# Patient Record
Sex: Female | Born: 1975 | Hispanic: Yes | Marital: Married | Smoking: Never smoker
Health system: Southern US, Community
[De-identification: ages and names within clinical notes are randomized; demographics above are authoritative.]

## PROBLEM LIST (undated history)

## (undated) DIAGNOSIS — N809 Endometriosis, unspecified: Secondary | ICD-10-CM

## (undated) DIAGNOSIS — N83209 Unspecified ovarian cyst, unspecified side: Secondary | ICD-10-CM

## (undated) DIAGNOSIS — E119 Type 2 diabetes mellitus without complications: Secondary | ICD-10-CM

## (undated) HISTORY — DX: Type 2 diabetes mellitus without complications: E11.9

## (undated) HISTORY — DX: Unspecified ovarian cyst, unspecified side: N83.209

## (undated) HISTORY — PX: OVARIAN CYST REMOVAL: SHX89

## (undated) HISTORY — DX: Endometriosis, unspecified: N80.9

---

## 1998-09-29 ENCOUNTER — Ambulatory Visit (HOSPITAL_COMMUNITY): Admission: RE | Admit: 1998-09-29 | Discharge: 1998-09-29 | Payer: Self-pay | Admitting: *Deleted

## 1998-12-15 ENCOUNTER — Ambulatory Visit (HOSPITAL_COMMUNITY): Admission: RE | Admit: 1998-12-15 | Discharge: 1998-12-15 | Payer: Self-pay

## 1999-02-18 ENCOUNTER — Encounter (HOSPITAL_COMMUNITY): Admission: RE | Admit: 1999-02-18 | Discharge: 1999-02-23 | Payer: Self-pay | Admitting: Obstetrics & Gynecology

## 1999-02-22 ENCOUNTER — Inpatient Hospital Stay (HOSPITAL_COMMUNITY): Admission: AD | Admit: 1999-02-22 | Discharge: 1999-02-24 | Payer: Self-pay | Admitting: *Deleted

## 2000-05-24 ENCOUNTER — Ambulatory Visit (HOSPITAL_COMMUNITY): Admission: RE | Admit: 2000-05-24 | Discharge: 2000-05-24 | Payer: Self-pay | Admitting: *Deleted

## 2000-09-18 ENCOUNTER — Encounter (HOSPITAL_COMMUNITY): Admission: AD | Admit: 2000-09-18 | Discharge: 2000-09-20 | Payer: Self-pay | Admitting: *Deleted

## 2000-09-23 ENCOUNTER — Inpatient Hospital Stay (HOSPITAL_COMMUNITY): Admission: AD | Admit: 2000-09-23 | Discharge: 2000-09-26 | Payer: Self-pay | Admitting: Obstetrics & Gynecology

## 2000-09-23 ENCOUNTER — Encounter (INDEPENDENT_AMBULATORY_CARE_PROVIDER_SITE_OTHER): Payer: Self-pay | Admitting: Specialist

## 2005-08-12 ENCOUNTER — Emergency Department (HOSPITAL_COMMUNITY): Admission: EM | Admit: 2005-08-12 | Discharge: 2005-08-13 | Payer: Self-pay | Admitting: Emergency Medicine

## 2005-11-27 ENCOUNTER — Ambulatory Visit: Payer: Self-pay | Admitting: Internal Medicine

## 2005-12-01 ENCOUNTER — Ambulatory Visit: Payer: Self-pay | Admitting: *Deleted

## 2006-10-31 ENCOUNTER — Encounter (INDEPENDENT_AMBULATORY_CARE_PROVIDER_SITE_OTHER): Payer: Self-pay | Admitting: *Deleted

## 2007-12-19 ENCOUNTER — Emergency Department (HOSPITAL_COMMUNITY): Admission: EM | Admit: 2007-12-19 | Discharge: 2007-12-20 | Payer: Self-pay | Admitting: Emergency Medicine

## 2007-12-23 ENCOUNTER — Inpatient Hospital Stay (HOSPITAL_COMMUNITY): Admission: AD | Admit: 2007-12-23 | Discharge: 2007-12-23 | Payer: Self-pay | Admitting: Obstetrics & Gynecology

## 2008-01-27 ENCOUNTER — Ambulatory Visit (HOSPITAL_COMMUNITY): Admission: RE | Admit: 2008-01-27 | Discharge: 2008-01-27 | Payer: Self-pay | Admitting: Family Medicine

## 2008-01-29 ENCOUNTER — Ambulatory Visit: Payer: Self-pay | Admitting: Obstetrics & Gynecology

## 2008-04-27 ENCOUNTER — Inpatient Hospital Stay (HOSPITAL_COMMUNITY): Admission: AD | Admit: 2008-04-27 | Discharge: 2008-04-30 | Payer: Self-pay | Admitting: Obstetrics & Gynecology

## 2008-04-27 ENCOUNTER — Ambulatory Visit: Payer: Self-pay | Admitting: Obstetrics & Gynecology

## 2008-04-27 ENCOUNTER — Encounter: Payer: Self-pay | Admitting: Obstetrics & Gynecology

## 2008-06-10 ENCOUNTER — Ambulatory Visit: Payer: Self-pay | Admitting: Obstetrics & Gynecology

## 2008-10-09 ENCOUNTER — Ambulatory Visit: Payer: Self-pay | Admitting: Obstetrics & Gynecology

## 2008-10-09 ENCOUNTER — Encounter: Payer: Self-pay | Admitting: Advanced Practice Midwife

## 2010-02-04 ENCOUNTER — Ambulatory Visit (HOSPITAL_COMMUNITY)
Admission: RE | Admit: 2010-02-04 | Discharge: 2010-02-04 | Payer: Self-pay | Source: Home / Self Care | Attending: Obstetrics & Gynecology | Admitting: Obstetrics & Gynecology

## 2010-02-04 ENCOUNTER — Ambulatory Visit: Payer: Self-pay | Admitting: Obstetrics & Gynecology

## 2010-02-14 ENCOUNTER — Ambulatory Visit: Payer: Self-pay | Admitting: Family Medicine

## 2010-05-09 ENCOUNTER — Emergency Department (HOSPITAL_COMMUNITY)
Admission: EM | Admit: 2010-05-09 | Discharge: 2010-05-10 | Disposition: A | Discharge status: Home / Self Care | Payer: Self-pay | Attending: Emergency Medicine | Admitting: Emergency Medicine

## 2010-05-09 DIAGNOSIS — W540XXA Bitten by dog, initial encounter: Secondary | ICD-10-CM | POA: Insufficient documentation

## 2010-05-09 DIAGNOSIS — S71109A Unspecified open wound, unspecified thigh, initial encounter: Secondary | ICD-10-CM | POA: Insufficient documentation

## 2010-05-09 DIAGNOSIS — S71009A Unspecified open wound, unspecified hip, initial encounter: Secondary | ICD-10-CM | POA: Insufficient documentation

## 2010-05-09 DIAGNOSIS — Y92009 Unspecified place in unspecified non-institutional (private) residence as the place of occurrence of the external cause: Secondary | ICD-10-CM | POA: Insufficient documentation

## 2010-05-26 LAB — CBC
Hemoglobin: 8 g/dL — ABNORMAL LOW (ref 12.0–15.0)
Hemoglobin: 8.2 g/dL — ABNORMAL LOW (ref 12.0–15.0)
Hemoglobin: 10.8 g/dL — ABNORMAL LOW (ref 12.0–15.0)
MCV: 87.2 fL (ref 78.0–100.0)
MCV: 87.5 fL (ref 78.0–100.0)
MCV: 86.5 fL (ref 78.0–100.0)
Platelets: 248 10*3/uL (ref 150–400)
Platelets: 270 10*3/uL (ref 150–400)
RDW: 13.1 % (ref 11.5–15.5)
RDW: 13 % (ref 11.5–15.5)
WBC: 11.6 10*3/uL — ABNORMAL HIGH (ref 4.0–10.5)

## 2010-05-26 LAB — URINALYSIS, ROUTINE W REFLEX MICROSCOPIC
Bilirubin Urine: NEGATIVE
Ketones, ur: NEGATIVE mg/dL
Nitrite: NEGATIVE
pH: 6.5 (ref 5.0–8.0)

## 2010-05-26 LAB — DIFFERENTIAL
Basophils Absolute: 0 10*3/uL (ref 0.0–0.1)
Eosinophils Relative: 0 % (ref 0–5)
Lymphs Abs: 1.4 10*3/uL (ref 0.7–4.0)
Monocytes Relative: 2 % — ABNORMAL LOW (ref 3–12)
Neutrophils Relative %: 86 % — ABNORMAL HIGH (ref 43–77)

## 2010-05-26 LAB — POCT PREGNANCY, URINE: Preg Test, Ur: NEGATIVE

## 2010-06-28 NOTE — Group Therapy Note (Signed)
NAME:  LOY, LITTLE NO.:  [id number]   MEDICAL RECORD NO.:  [id number]          PATIENT TYPE:  WOC   LOCATION:  [facility]                   FACILITY:  [facility]   PHYSICIAN:  Elsie Lincoln, MD      DATE OF BIRTH:  1975/12/14   DATE OF SERVICE:                                  CLINIC NOTE   HISTORY:  The patient is a 35 year old female who presents for postop  check.  The patient is Hispanic-speaking, however, they have forgone the  Spanish interpreter in order to expedite their visit.  The husband is  translating.  The patient underwent an exploratory laparotomy, lysis of  adhesions, left salpingo-oophorectomy, right salpingectomy and right  ovarian cystectomy.  The patient had extensive endometriosis.  The  patient has done very well postoperatively.  She is clear for all sexual  activity and physical activity.  The patient does still have an IUD in  situ and I offered to remove it today, but she would like to wait until  August when she is due for her Pap smear.  She normally gets her Pap  smears done at [facility].  However, I do not know if they  will do it now that she has bilateral salpingectomies and no longer  needs family planning.  We will see her back here in August for that.  Also in the past 6 weeks, the patient has not had a period, so I want to  make sure she is not in menopause which is another reason I would like  to do surveillance of her in August to make sure that she is  menstruating.  The patient's IUD is a 10 year ParaGard, so there is no  progesterone involved.   PHYSICAL EXAMINATION:  VITAL SIGNS:  Temperature 97.4, pulse 80, blood  pressure 116/81, weight 160.5, height 64 inches.  GENERAL:  Well-nourished, well-developed, in no apparent distress.  ABDOMEN:  Soft, nontender.  No rebound.  No guarding.  Incision well  healed.   ASSESSMENT/PLAN:  A 35 year old female with normal postoperative exam.  1. Pap smear.  2.  Intrauterine device removal in August.           ______________________________  Elsie Lincoln, MD     KL/MEDQ  D:  06/10/2008  T:  06/10/2008  Job:  [id number]

## 2010-06-28 NOTE — Op Note (Signed)
NAME:  Sherry Ramirez, Sherry Ramirez      ACCOUNT NO.:  [id number]   MEDICAL RECORD NO.:  [id number]          PATIENT TYPE:  INP   LOCATION:  9316                          [facility]   PHYSICIAN:  Allie Bossier, MD        DATE OF BIRTH:  07/06/1975   DATE OF PROCEDURE:  04/28/2008  DATE OF DISCHARGE:                               OPERATIVE REPORT   PREOPERATIVE DIAGNOSIS:  Rapidly enlarging bilateral ovarian cyst  probable endometrioma.   POSTOPERATIVE DIAGNOSIS:  Rapidly enlarging bilateral ovarian cyst  probable endometrioma with an adhesions.   PROCEDURE:  Exploratory laparotomy, lysis of adhesions, removal of right  ovarian cyst, and right oviduct, and left salpingo-oophorectomy.   SURGEON:  Allie Bossier, MD   ASSISTANT:  Lonzo Cloud, PA student.   ANESTHESIA:  GETA Foster/Hatchett, MD   COMPLICATIONS:  None.   ESTIMATED BLOOD LOSS:  Less than 300 mL.   SPECIMENS:  Right ovarian cyst and right oviduct, and left ovary and  oviduct.   FINDINGS:  1. Dense adhesions of the ovaries bilaterally to the bowel and cul-de-      sac and uterus.  2. Evidence of endometriosis throughout the pelvis.  3. Endometriomas bilaterally.   DETAILED PROCEDURE AND FINDINGS:  Preoperatively, I explained Sherry  Ramirez on multiple occasions with the help of the interpreter at  __________ that I would do my best to leave part of an ovary, so that  she could would need hormones, this is what she desires.  I also  explained that should it be possible then I would have to take both  ovaries entirely out.  I also explained the risks of surgery including,  but not limited to infection, bleeding, damage to bowel, bladder,  ureter, and DVTs.  Preoperatively, she was given IV antibiotics and SCDs  were in place preoperatively and throughout the case.  In the OR, her  general anesthetic was applied without complication.  Her abdomen and  vagina were prepped and draped in the usual sterile  fashion.  Foley  catheter which drained 200 mL of clear urine throughout the case.  A  transverse incision was made approximately 2 cm above the symphysis  pubis.  Incision was carried down through the subcutaneous tissue to the  fascia.  Fascia was scored in the midline.  Fascial incision was  extended bilaterally and curved slightly upwards.  The middle one third  of the rectus muscles were separated in a transverse fashion using  electrosurgical technique.  The peritoneum was elevated with hemostats  and incised with Metzenbaum scissors.  The peritoneal incision was  extended bilaterally with the Metzenbaum scissors.  There was a very  large (approximately 11 cm) right ovarian cyst just beneath the  peritoneum.  It appeared to be an endometrioma based on the brown  staining lesions on the outside of the ovary.  I did obtain washings  should it turn out to not be an endometrioma.  Manually, I was able to  feel the extent of the cyst/ovary, it was densely adherent to the  posterior cul-de-sac and the bowel and the uterus in the course of  trying to dissect these adhesions delicately with my fingers.  A large  amount of chocolate cyst fluid was noted, multiple liters of irrigation  were used.  Once the cyst had ruptured, I was able to visualize the  entire pelvis.  The bowel was packed with moist lap sponges and the  abdominal sidewalls were protected with moist lap sponges and O'Connor-  O'Sullivan self-retaining retractor was placed.  The cyst wall of the  right ovary was grasped with a Babcock clamp and the pelvis was  inspected.  I was unable to see the ureter on the right, therefore I  dissected it out by opening the medial wall of the broad ligament.  With  careful dissection, I was able to visualize the ureter on the right and  I was able to dissect out the infundibulopelvic ligament.  The IP  ligament was doubly clamped and ligated.  A 2-0 Vicryl sutures used  throughout this case  unless otherwise specified.  I then proceeded with  careful dissection of the ovarian cyst wall off the wall of the uterus  in the posterior cul-de-sac.  I was able to remove the cyst.  There was  approximately 1 cm above normal ovarian tissue that I left connected to  the utero-ovarian ligaments.  The remainder of the cyst wall and oviduct  on the right were removed and sent to pathology.  Pelvis was irrigated  again, copious amounts of warm normal saline, and I proceeded to inspect  the left ovary.  It was approximately 4 cm in diameter, it appeared to  be tense, which I would suspect to be a chocolate cyst.  Left ureter was  plainly visible and out of the operative site.  I opened the cyst wall  on the left just to confirm there was endometrioma and it was.  I  dissected out the infundibulopelvic ligament on the left, it was doubly  clamped, cut, and doubly ligated with 2-0 Vicryl suture.  The utero-  ovarian ligament on the left was clamped, cut, and ligated, and the  ovary and tube on the left were removed.  The pelvis was irrigated with  copious amounts of warm normal saline.  Both ureters appeared normal,  and I placed a piece of Gelfoam over some small serosal edges in the  ovarian fossa on both sides.  No bleeding was noted.  The sponges were  removed as well as the O'Connor-O'Sullivan self-retaining retractor.  The fascia was closed with a 0 Vicryl running nonlocking suture.  No  defects were palpable.  The subcutaneous tissue was irrigated cleaned  and dried.  It was then infiltrated with 30 mL of 0.5% Marcaine.  A  subcuticular closure was done with 3-0 Vicryl suture.  Steri-Strips were  placed.  She was taken to recovery room in stable condition.  Instruments, sponge, and needles counts were correct.  Her Foley  catheter drained 200 mL of clear urine.  She was given 1700 mL of  crystalloid.  She tolerated the surgery well.      Allie Bossier, MD  Electronically  Signed     MCD/MEDQ  D:  04/28/2008  T:  04/28/2008  Job:  [id number]

## 2010-06-28 NOTE — Group Therapy Note (Signed)
NAME:  Sherry Ramirez, Sherry Ramirez NO.:  [id number]   MEDICAL RECORD NO.:  [id number]          PATIENT TYPE:  WOC   LOCATION:  [facility]                   [facility]ACILITY:  [facility]   PHYSICIAN:  Scheryl Darter, MD       DATE OF BIRTH:  Jul 07, 1975   DATE OF SERVICE:  10/09/2008                                  CLINIC NOTE   HISTORY OF PRESENT ILLNESS:  This is a 35 year old, gravida 4, para 4,  who is not pregnant, who presents for her annual exam and removal of  IUD.  She was seen for a postop check in April following a surgery that  occurred in March which included exploratory laparotomy, lysis of  adhesions, left salpingo-oophorectomy, and right salpingectomy with a  right ovarian cystectomy.  The patient had extensive endometriosis.  She  did well postoperatively and is requesting removal of her ParaGard IUD.  She is concerned about her chances of getting pregnant, status post  surgery and does not want to get pregnant and is also requesting a Pap  smear with, but declines cultures at this time.   MEDICAL HISTORY:  Remarkable for endometriosis.  The patient is  otherwise healthy.   SURGICAL HISTORY:  Remarkable for the above-mentioned surgery in March  2010 and 4 vaginal deliveries.   FAMILY HISTORY:  Noncontributory.   SOCIAL HISTORY:  The patient lives with husband and 4 children.   GYNECOLOGIC HISTORY:  Remarkable for ovarian cysts and endometriosis.  She reports having period in July which lasted 9 days and another one in  August which lasted 7 days, but was reported as heavier than normal.   ALLERGIES:  None.   OBJECTIVE DATA:  VITAL SIGNS:  Temperature 97.7, pulse 86, blood  pressure 120/85, weight 168, height 63 inches.  HEENT:  Within normal limits.  Thyroid normal, not enlarged.  CHEST:  Clear to auscultation.  HEART:  Regular rate and rhythm.  BREASTS:  Soft, nontender, no masses.  ABDOMEN:  Soft, nontender.  There is a well-healed low transverse  abdominal scar  which is without any exudate or erythema.  PELVIC:  Normal external genitalia with normal vaginal rugae.  No lesions and no  masses.  Cervix is multiparous.  Pap smear was obtained.  Uterus is  normal, small, mobile, nontender.  Adnexa are both clear.  Right ovary  is not palpable.  EXTREMITIES:  Within normal limits.   IUD was removed intact, noted to be ParaGard.  There was no trauma  associated with removal of IUD which came out easily with forceps.   ASSESSMENT:  24. A well 35 year old, gravida 4, para 4 here for her annual exam.  2. ParaGard intrauterine device, removed per request.  3. Good recovery, status post exploratory laparotomy with above-      mentioned procedures.   PLAN:  1. Pap smear obtained.  2. IUD removed.  3. The patient has no further requests, and will return for annual      exam in 1 year.     ______________________________  Wynelle Bourgeois, CNM    ______________________________  Scheryl Darter, MD    MW/MEDQ  D:  10/09/2008  T:  10/10/2008  Job:  [id number]

## 2010-06-28 NOTE — H&P (Signed)
NAME:  Sherry Ramirez, Sherry Ramirez NO.:  [id number]   MEDICAL RECORD NO.:  [id number]          PATIENT TYPE:  MAT   LOCATION:  [facility]                          [facility]   PHYSICIAN:  Allie Bossier, MD        DATE OF BIRTH:  15-Jul-1975   DATE OF ADMISSION:  04/27/2008  DATE OF DISCHARGE:                              HISTORY & PHYSICAL   This is a 35 year old married Hispanic gravida 4, para 4 who was seen in  our GYN Clinic recently for evaluation of bilateral complex adnexal  masses.  She is scheduled for a laparoscopy and removal of both cysts  next week and she has been at home on ibuprofen and Vicodin; however,  early in the wee hours of this morning, she came to the MAU because the  Vicodin and ibuprofen were not alleviating her pain.  An ultrasound done  here today shows that the right ovary contains a cyst now that is 11.8 x  11.4 cm and the left now contains a 2.8 x 2.3 x 3.4 complex mass.  Please note that on an ultrasound done in December 2009, the right ovary  only measured 7.7 cm in its maximum diameter.  On ultrasound, these are  highly suspicious for endometriomas.  She was already aware that at the  time of surgery, it may be necessary to remove all of the ovarian tissue  and thankfully she has already had four children.   PAST MEDICAL HISTORY:  None.   PAST SURGICAL HISTORY:  None.   SOCIAL HISTORY:  She denies tobacco, alcohol, or drug use.   FAMILY HISTORY:  Negative for breast, GYN, and colon malignancies.   ALLERGIES:  No known drug allergies.  No latex allergies.   MEDICATIONS:  She takes ibuprofen and Vicodin as necessary at home on a  recent basis.   PHYSICAL EXAMINATION:  VITAL SIGNS:  Stable.  Afebrile.  Her pulse is  129 but this pulse improved markedly after administration of Zofran and  a Dilaudid.  GENERAL:  She currently appears comfortable, but initially did appear to  be in a fair amount of abdominal pain.  HEART:  Regular rate and  rhythm.  LUNGS:  Clear to auscultation bilaterally.  ABDOMEN:  Obese and benign.  Pelvic masses appreciated bilaterally.  She  is tender, but it is not a surgical abdomen.  Please note that on  ultrasound, color Doppler flow was seen to both ovaries.   Hemoglobin is 10.   ASSESSMENT AND PLAN:  Symptomatic bilateral adnexal masses, probable  endometriomas, currently they are rapidly growing.  Her surgery date  will be changed from next week until tomorrow.  In the meantime, I will  admit her to the hospital for IV pain management.      Allie Bossier, MD  Electronically Signed     MCD/MEDQ  D:  04/27/2008  T:  04/27/2008  Job:  [id number]

## 2010-06-28 NOTE — Group Therapy Note (Signed)
NAME:  Sherry Ramirez, Sherry Ramirez NO.:  [id number]   MEDICAL RECORD NO.:  [id number]          PATIENT TYPE:  WOC   LOCATION:  [facility]                   [facility]ACILITY:  [facility]   PHYSICIAN:  Allie Bossier, MD        DATE OF BIRTH:  06-Aug-1975   DATE OF SERVICE:                                  CLINIC NOTE   REASON FOR VISIT:  The patient was seen in the MAU at [facility] within several days of each other in early November 2009, found  to have 2 ovarian cysts on ultrasound and is here for a followup.  The  patient is a 35 year old G4, P4, who has had several-month history of  lower abdominal pain.  She was evaluated in the MAU and sent for  ultrasound.  Transabdominal and transvaginal ultrasound of the pelvis  was done on January 27, 2008, and showed the following,  1. Bilateral adnexal complex cystic lesions on the right ovary      measuring 7.7 x 6.0 x 7.2 cm and the right ovary measuring 4.1 x      3.0 x 3.2 cm.  According to the radiology report, features were      highly suspicious for bilateral endometriomas, however, the      differential diagnosis also included bilateral cystic ovarian      neoplasms or hemorrhagic ovarian cysts, although, these were      considered less likely.  2. Normal appearance of the uterus and IUD in a proper location.   Options were discussed with the patient including use of Lupron to  control her symptoms or surgery to remove either part or all of the  ovaries.  The risks and benefits of each option was explained to both  the patient and her husband, and the patient and her husband were both  more interested in the surgical option and then spoke with Dr. Marice Potter  about the risks and benefits of this particular surgery.  The patient  and her husband have decided to go ahead with surgery, and this should  be posted for March or April of this year depending on Dr. Ellin Saba  scheduled.  The patient does understand that it is likely that both  ovaries will have to be completely removed and that this would cause  surgical menopause, and she expressed understanding with acceptance of  that risk.     ______________________________  Argentina Donovan, MD    ______________________________  Allie Bossier, MD    PR/MEDQ  D:  01/29/2008  T:  01/30/2008  Job:  [id number]

## 2010-06-28 NOTE — Discharge Summary (Signed)
NAME:  Sherry Ramirez, Sherry Ramirez      ACCOUNT NO.:  [id number]   MEDICAL RECORD NO.:  [id number]          PATIENT TYPE:  INP   LOCATION:  9316                          [facility]   PHYSICIAN:  Allie Bossier, MD        DATE OF BIRTH:  05-21-1975   DATE OF ADMISSION:  04/27/2008  DATE OF DISCHARGE:  04/30/2008                               DISCHARGE SUMMARY   ADMISSION DIAGNOSIS:  Greatly enlarging endometrioma.   DISCHARGE DIAGNOSIS:  Greatly enlarging endometrioma.   DISPOSITION:  Home.   DIET:  As tolerated.   ACTIVITY:  As tolerated.   CONDITION:  Stable.   PROCEDURE DURING STAY:  Exploratory laparotomy, lysis of adhesions,  right ovarian cystectomy, right salpingectomy, left salpingo-  oophorectomy.   DETAILED PROCEDURE AND FINDINGS:  [name] is a 35 year old married  Hispanic gravida 4, para 4 who was seen in the [facility] recently for  evaluation of bilateral complex adnexal masses.  She was scheduled for  laparoscopic removal of both cysts to be done in the third week in  March; however, she arrived in the emergency room on the 15th in the  early morning hours complaining of severe pain.  An ultrasound done  right then showed the right ovarian cyst was now almost 12 cm,  previously it had been 7 cm.  The left ovary ovarian cyst was now almost  8 cm as well.  There was flow noted, but her pain was such that she  could not reasonably go home, so I admitted her overnight and operated  on her.  Following morning, she underwent an uncomplicated exploratory  laparotomy, lysis of extensive adhesions, removal of bilateral  endometriomas, and left portion of normal ovary on the right for her  future ovarian hormone production and removed the entire left ovary and  tube.  I did remove the tube on the left as well as she does not want  any more children.  She currently has an IUD in place, this will be  removed at her 6-week visit.  There was extensive pelvic endometriosis  noted (stage IV); however, she did not complain of pain until the  ovarian cysts became apparent.  She remained afebrile throughout her  hospital course.  She tolerated p.o., was voiding without dysuria and  ambulating.  By the day of discharge, she was experiencing flatus and  had no nausea or vomiting.  She voiced her readiness to go home on  postoperative day #2 and follow up in 6 weeks or as necessary sooner.  Please note, her physical exam was normal.  Her abdomen was benign with  normoactive bowel sounds.  Her incision was clean, dry, and intact.  Preoperatively, her hemoglobin was 10.8.  On postop day #1, it was 8.2.  On postop day #3, it was 8.0.  She will follow up as above.     Allie Bossier, MD  Electronically Signed    MCD/MEDQ  D:  04/30/2008  T:  04/30/2008  Job:  [id number]

## 2010-11-15 LAB — DIFFERENTIAL
Basophils Relative: 0
Eosinophils Relative: 0
Monocytes Absolute: 0.5
Neutrophils Relative %: 88 — ABNORMAL HIGH

## 2010-11-15 LAB — COMPREHENSIVE METABOLIC PANEL
ALT: 27
AST: 27
Alkaline Phosphatase: 85
CO2: 26
Chloride: 104
GFR calc Af Amer: >60
GFR calc non Af Amer: >60
Glucose, Bld: 118 — ABNORMAL HIGH
Sodium: 136
Total Protein: 7.1

## 2010-11-15 LAB — URINALYSIS, ROUTINE W REFLEX MICROSCOPIC
Bilirubin Urine: NEGATIVE
Bilirubin Urine: NEGATIVE
Hgb urine dipstick: NEGATIVE
Ketones, ur: NEGATIVE
Ketones, ur: NEGATIVE
Nitrite: NEGATIVE
Protein, ur: NEGATIVE
Protein, ur: NEGATIVE
Urobilinogen, UA: 0.2
pH: 5.5

## 2010-11-15 LAB — CBC
MCHC: 34.3
Platelets: 287
RDW: 12.9

## 2010-11-15 LAB — URINE MICROSCOPIC-ADD ON

## 2010-11-15 LAB — PREGNANCY, URINE: Preg Test, Ur: NEGATIVE

## 2010-11-15 LAB — POCT PREGNANCY, URINE: Preg Test, Ur: NEGATIVE

## 2012-11-25 DIAGNOSIS — N801 Endometriosis of ovary: Secondary | ICD-10-CM | POA: Insufficient documentation

## 2012-11-26 ENCOUNTER — Encounter: Payer: Self-pay | Admitting: Obstetrics and Gynecology

## 2012-11-26 ENCOUNTER — Encounter: Payer: Self-pay | Admitting: *Deleted

## 2012-11-27 ENCOUNTER — Ambulatory Visit (INDEPENDENT_AMBULATORY_CARE_PROVIDER_SITE_OTHER): Payer: Self-pay | Admitting: Obstetrics and Gynecology

## 2012-11-27 ENCOUNTER — Encounter (INDEPENDENT_AMBULATORY_CARE_PROVIDER_SITE_OTHER): Payer: Self-pay

## 2012-11-27 ENCOUNTER — Encounter: Payer: Self-pay | Admitting: Obstetrics and Gynecology

## 2012-11-27 VITALS — BP 110/72 | Ht 63.0 in | Wt 177.0 lb

## 2012-11-27 DIAGNOSIS — R102 Pelvic and perineal pain: Secondary | ICD-10-CM

## 2012-11-27 DIAGNOSIS — N949 Unspecified condition associated with female genital organs and menstrual cycle: Secondary | ICD-10-CM

## 2012-11-27 DIAGNOSIS — N801 Endometriosis of ovary: Secondary | ICD-10-CM

## 2012-11-27 MED ORDER — IBUPROFEN 600 MG PO TABS: 600.0000 mg | ORAL_TABLET | Freq: Four times a day (QID) | ORAL | Status: DC | PRN

## 2012-11-27 MED ORDER — NORGESTIMATE-ETH ESTRADIOL 0.25-35 MG-MCG PO TABS: 1.0000 | ORAL_TABLET | Freq: Every day | ORAL | Status: AC

## 2012-11-27 NOTE — Progress Notes (Signed)
Patient ID: Sherry Ramirez, female   DOB: [birth year], 37 y.o.   MRN: [id number] Pt here today for evaluation of ovarian cyst, endometriosis, and pelvic pain. Pt states she has had the severe pelvic pain for about 3 weeks but started feeling discomfort in July Today pain is mild , managed with tylenol. U/s limited  Shows no obvious cysts in left adx. PT STATES THE PAIN GOES DOWN THE INSIDE OFRIGHT LEG WHEN SEVERE. NO SCIATICA. pAIN IS MORE CAUSED BY ABDOMINAL PRESSURE THAN BY VAG PROBE OR BIMANUAL INTERNAL EXAM.  .A. Endometriosis by hx     Prob chronic adhesions. P suppress ovary with  OCP   rechk 6-8 nwk   Motrin

## 2012-12-04 ENCOUNTER — Encounter: Payer: Self-pay | Admitting: Obstetrics and Gynecology

## 2012-12-06 ENCOUNTER — Encounter: Payer: Self-pay | Admitting: Obstetrics and Gynecology

## 2013-01-06 ENCOUNTER — Telehealth: Payer: Self-pay

## 2013-03-06 NOTE — Telephone Encounter (Signed)
Closed encounter °

## 2013-03-11 ENCOUNTER — Telehealth: Payer: Self-pay

## 2013-03-18 NOTE — Telephone Encounter (Signed)
Unable to reach pt, left multiple messages with no return call.

## 2013-06-18 ENCOUNTER — Telehealth: Payer: Self-pay

## 2013-06-18 NOTE — Telephone Encounter (Signed)
I called pt and pt asked to speak with Maralyn SagoSarah, the pt was advised to call [facility] [place]. Pt stated that she would do this and if there were any questions she would have the pharmacy call the office.

## 2013-07-21 ENCOUNTER — Ambulatory Visit: Payer: Self-pay | Admitting: Obstetrics and Gynecology

## 2013-12-15 ENCOUNTER — Encounter: Payer: Self-pay | Admitting: Obstetrics and Gynecology

## 2014-04-24 ENCOUNTER — Ambulatory Visit: Payer: Self-pay | Admitting: Obstetrics and Gynecology

## 2014-05-01 ENCOUNTER — Ambulatory Visit (INDEPENDENT_AMBULATORY_CARE_PROVIDER_SITE_OTHER): Payer: BLUE CROSS/BLUE SHIELD | Admitting: Obstetrics and Gynecology

## 2014-05-01 ENCOUNTER — Encounter: Payer: Self-pay | Admitting: Obstetrics and Gynecology

## 2014-05-01 VITALS — BP 128/60 | Ht 63.0 in | Wt 182.0 lb

## 2014-05-01 DIAGNOSIS — N806 Endometriosis in cutaneous scar: Secondary | ICD-10-CM | POA: Diagnosis not present

## 2014-05-01 DIAGNOSIS — R1031 Right lower quadrant pain: Secondary | ICD-10-CM | POA: Diagnosis not present

## 2014-05-01 NOTE — Patient Instructions (Signed)
Please contact Dr Lily PeerFernandez office at [phone] for followup

## 2014-05-01 NOTE — Progress Notes (Signed)
Patient ID: Sherry Ramirez, female   DOB: [birth year], 39 y.o.   MRN: [id number]    [facility] Visit  Patient name: Sherry Ramirez MRN [id number]  Date of birth: 12/03/1975  CC & HPI:  Sherry Ramirez is a 39 y.o. female s/p bilateral ovarian cyst removal, left salpingoophorectomy and right salpingectomy presenting today for intermittent RLQ pain associated with history of endometriosis. Pt notes current episode of pain started gradually 2 months , but on clarification, but the pain seems to have been there since her laparotomy, 2010, for  Bilateral endometriomas.. She reports pain becomes worse with walking. Pt does not have regular menstrual cycles and reports it has been several months since her last menses. She was seen in 2014 for the same pain.   ROS:  A complete 10 system review of systems was obtained and all systems are negative except as noted in the HPI and PMH.   Pertinent History Reviewed:   Reviewed: Significant for bilateral ovarian cyst removal; left salpingoophorectomy and right salpingectomy by Dr Isac Sarna. Medical         Past Medical History  Diagnosis Date  . Endometriosis   . Ovarian cyst                               Surgical Hx:    Past Surgical History  Procedure Laterality Date  . Ovarian cyst removal Bilateral     left salpingoophorectomy, right salpingectomy, right cystectomy 04/2008   Medications: Reviewed & Updated - see associated section                       Current outpatient prescriptions:  .  norgestimate-ethinyl estradiol (ORTHO-CYCLEN,SPRINTEC,PREVIFEM) 0.25-35 MG-MCG tablet, Take 1 tablet by mouth daily., Disp: 1 Package, Rfl: 11   Social History: Reviewed -  reports that she has never smoked. She has never used smokeless tobacco.  Objective Findings:  Vitals: Blood pressure 128/60, height  (1.6 m), weight 182 lb (82.555 kg).  Physical Examination: General appearance - alert, well appearing, and in  no distress Mental status - alert, oriented to person, place, and time Pelvic - normal external genitalia, vulva, vagina, cervix, uterus and adnexa VULVA: normal appearing vulva with no masses, tenderness or lesions VAGINA: normal appearing vagina with normal color and discharge, no lesions CERVIX: normal appearing cervix without discharge or lesions UTERUS: uterus is normal size, shape, consistency and nontender ADNEXA: normal adnexa in size, nontender and no masses   Assessment & Plan:   A:  1. History of endometriosis and bilateral ovarian cysts, (endometriomas) 2. Recurrent chronic RLQ pain felt to be abdominal wall pain either a trigger point , or a small abd wall implant of endometriosis in the old surgical scar. 3. Discussion of possible surgical options such as exploration of the old scar, possibly to consider laparotomy and tah rso, and hormone replacement therapy Because of the need for clear communication for such subtle planning, and because pt prefers spanish with her provider, will refer to Dr Lily Peer to discuss further , and leave u/s for his office to perform.  P:       This chart was scribed for Tilda Burrow, MD by Gwenyth Ober, ED Scribe. This patient was seen in room 3 and the patient's care was started at 10:48 AM.   I personally performed the services described in this documentation, which was SCRIBED in my presence. The  recorded information has been reviewed and considered accurate. It has been edited as necessary during review. Tilda BurrowFERGUSON,Roe Wilner V, MD

## 2014-05-01 NOTE — Addendum Note (Signed)
Addended by: Tilda BurrowFERGUSON, Adryen Cookson V on: 05/01/2014 11:27 AM   Modules accepted: Level of Service

## 2014-05-01 NOTE — Progress Notes (Signed)
Patient ID: Sherry Ramirez, female   DOB: November 03, 1975, 39 y.o.   MRN: [id number] Pt here today for follow up on endometriosis. Pt states that she has been having pain.

## 2015-05-05 ENCOUNTER — Ambulatory Visit: Payer: Self-pay | Admitting: Physician Assistant

## 2015-05-11 ENCOUNTER — Encounter: Payer: Self-pay | Admitting: Physician Assistant

## 2015-05-11 ENCOUNTER — Ambulatory Visit: Payer: Self-pay | Admitting: Physician Assistant

## 2015-05-11 VITALS — BP 106/68 | HR 76 | Temp 97.5°F | Ht 63.0 in | Wt 180.0 lb

## 2015-05-11 DIAGNOSIS — E669 Obesity, unspecified: Secondary | ICD-10-CM

## 2015-05-11 NOTE — Progress Notes (Signed)
   BP 106/68 mmHg  Pulse 76  Temp(Src) 97.5 F (36.4 C)  Ht 5\' 3"  (1.6 m)  Wt 180 lb (81.647 kg)  BMI 31.89 kg/m2  SpO2 98%   Subjective:    Patient ID: Sherry Ramirez, female    DOB: Mar 13, 1975, 40 y.o.   MRN: [id number]  HPI: Sherry Ramirez is a 40 y.o. female presenting on 05/11/2015 for Gastroesophageal Reflux   HPI Pt has insurance.  She had it all last year when we were seeing her for free and she didn't communicate that information  Relevant past medical, surgical, family and social history reviewed and updated as indicated. Interim medical history since our last visit reviewed. Allergies and medications reviewed and updated.  Review of Systems  Per HPI unless specifically indicated above     Objective:    BP 106/68 mmHg  Pulse 76  Temp(Src) 97.5 F (36.4 C)  Ht 5\' 3"  (1.6 m)  Wt 180 lb (81.647 kg)  BMI 31.89 kg/m2  SpO2 98%  Wt Readings from Last 3 Encounters:  05/11/15 180 lb (81.647 kg)  05/01/14 182 lb (82.555 kg)  11/27/12 177 lb (80.287 kg)    Physical Exam  Constitutional: She is oriented to person, place, and time. She appears well-developed and well-nourished.  Pulmonary/Chest: Effort normal.  Neurological: She is alert and oriented to person, place, and time.  Psychiatric: She has a normal mood and affect. Her behavior is normal.  Vitals reviewed.       Assessment & Plan:    Can't come to [facility] with [facility]!

## 2015-05-12 DIAGNOSIS — E669 Obesity, unspecified: Secondary | ICD-10-CM | POA: Insufficient documentation

## 2017-11-19 ENCOUNTER — Other Ambulatory Visit (HOSPITAL_COMMUNITY): Payer: Self-pay | Admitting: *Deleted

## 2017-11-19 DIAGNOSIS — Z1231 Encounter for screening mammogram for malignant neoplasm of breast: Secondary | ICD-10-CM

## 2018-01-31 ENCOUNTER — Ambulatory Visit (HOSPITAL_COMMUNITY)
Admission: RE | Admit: 2018-01-31 | Discharge: 2018-01-31 | Disposition: A | Discharge status: Home / Self Care | Payer: Self-pay | Source: Ambulatory Visit | Attending: Obstetrics and Gynecology | Admitting: Obstetrics and Gynecology

## 2018-01-31 ENCOUNTER — Ambulatory Visit
Admission: RE | Admit: 2018-01-31 | Discharge: 2018-01-31 | Disposition: A | Discharge status: Home / Self Care | Payer: Self-pay | Source: Ambulatory Visit | Attending: Obstetrics and Gynecology | Admitting: Obstetrics and Gynecology

## 2018-01-31 ENCOUNTER — Encounter (HOSPITAL_COMMUNITY): Payer: Self-pay

## 2018-01-31 VITALS — BP 124/76 | Ht 63.0 in | Wt 191.0 lb

## 2018-01-31 DIAGNOSIS — Z1239 Encounter for other screening for malignant neoplasm of breast: Secondary | ICD-10-CM

## 2018-01-31 DIAGNOSIS — Z1231 Encounter for screening mammogram for malignant neoplasm of breast: Secondary | ICD-10-CM

## 2018-01-31 NOTE — Patient Instructions (Signed)
Explained breast self awareness with Sherry Ramirez. Patient did not need a Pap smear today due to last Pap smear was in October 2019 per patient. Let her know BCCCP will cover Pap smears every 3 years unless has a history of abnormal Pap smears. [facility] screening mamm[facility]. Appointment scheduled for Thursday, January 31, 2018 at 1510. Patient aware of appointment and will be there. Let patient know the Breast Center will follow up with her within the next couple weeks with results of mammogram by letter or phone. Sherry Ramirez verbalized understanding.  Tabitha Riggins, Kathaleen Maserhristine Poll, RN 1:35 PM

## 2018-01-31 NOTE — Progress Notes (Signed)
No complaints today.   Pap Smear: Pap smear not completed today. Last Pap smear was in October 2019 at the [facility] in [place] and normal per patient. Per patient has no history of an abnormal Pap smear. Last Pap smear result is not in Epic. Previous Pap smear result on 02/04/2010 is in Epic.  Physical exam: Breasts Breasts symmetrical. No skin abnormalities bilateral breasts. No nipple retraction bilateral breasts. No nipple discharge bilateral breasts. No lymphadenopathy. No lumps palpated bilateral breasts. No complaints of pain or tenderness on exam. [facility] a screening mammogram. Appointment scheduled for Thursday, January 31, 2018 at 1510.        Pelvic/Bimanual No Pap smear completed today since last Pap smear was in October 2019 per patient. Pap smear not indicated per BCCCP guidelines.   Smoking History: Patient has never smoked.  Patient Navigation: Patient education provided. [facility] to services provided [facility]. Spanish interpreter provided.   Breast and Cervical Cancer Risk Assessment: Patient has no family history of breast cancer, known genetic mutations, or radiation treatment to the chest before age 42. Patient has no history of cervical dysplasia, immunocompromised, or DES exposure in-utero.  Risk Assessment    Risk Scores      01/31/2018   Last edited by: Priscille HeidelbergBrannock, Zarria Towell P, RN   5-year risk: 0.3 %   Lifetime risk: 5.1 %         Used Spanish interpreter Health [facility]orda Royal at [facility].

## 2018-02-12 ENCOUNTER — Encounter (HOSPITAL_COMMUNITY): Payer: Self-pay | Admitting: *Deleted

## 2019-02-05 ENCOUNTER — Ambulatory Visit: Payer: HRSA Program | Attending: Internal Medicine

## 2019-02-05 ENCOUNTER — Other Ambulatory Visit: Payer: Self-pay

## 2019-02-05 DIAGNOSIS — Z20828 Contact with and (suspected) exposure to other viral communicable diseases: Secondary | ICD-10-CM | POA: Diagnosis present

## 2019-02-05 DIAGNOSIS — Z20822 Contact with and (suspected) exposure to covid-19: Secondary | ICD-10-CM

## 2019-02-06 LAB — NOVEL CORONAVIRUS, NAA: SARS-CoV-2, NAA: NOT DETECTED

## 2020-06-06 ENCOUNTER — Other Ambulatory Visit: Payer: Self-pay

## 2020-06-06 ENCOUNTER — Emergency Department (HOSPITAL_BASED_OUTPATIENT_CLINIC_OR_DEPARTMENT_OTHER): Payer: Self-pay

## 2020-06-06 ENCOUNTER — Emergency Department (HOSPITAL_BASED_OUTPATIENT_CLINIC_OR_DEPARTMENT_OTHER)
Admission: EM | Admit: 2020-06-06 | Discharge: 2020-06-06 | Disposition: A | Discharge status: Home / Self Care | Payer: Self-pay | Attending: Emergency Medicine | Admitting: Emergency Medicine

## 2020-06-06 DIAGNOSIS — S92425A Nondisplaced fracture of distal phalanx of left great toe, initial encounter for closed fracture: Secondary | ICD-10-CM | POA: Insufficient documentation

## 2020-06-06 DIAGNOSIS — S92211A Displaced fracture of cuboid bone of right foot, initial encounter for closed fracture: Secondary | ICD-10-CM | POA: Insufficient documentation

## 2020-06-06 DIAGNOSIS — S92024A Nondisplaced fracture of anterior process of right calcaneus, initial encounter for closed fracture: Secondary | ICD-10-CM | POA: Insufficient documentation

## 2020-06-06 DIAGNOSIS — W108XXA Fall (on) (from) other stairs and steps, initial encounter: Secondary | ICD-10-CM | POA: Insufficient documentation

## 2020-06-06 MED ORDER — OXYCODONE-ACETAMINOPHEN 5-325 MG PO TABS
2.0000 | ORAL_TABLET | Freq: Once | ORAL | Status: AC
  Administered 2020-06-06: 2 via ORAL
  Filled 2020-06-06: qty 2

## 2020-06-06 MED ORDER — OXYCODONE-ACETAMINOPHEN 5-325 MG PO TABS: 1.0000 | ORAL_TABLET | Freq: Four times a day (QID) | ORAL | 0 refills | Status: AC | PRN

## 2020-06-06 NOTE — ED Provider Notes (Signed)
[facility] EMERGENCY DEPT Provider Note   CSN: [id number] Arrival date & time: 06/06/20  1841     History Chief Complaint  Patient presents with  . Fall  . Foot Pain    Bilateral    Sherry Ramirez is a 45 y.o. female.  She missed a step while walking down from her backyard deck. She cannot bear weight.  The history is provided by the patient.  Fall This is a new problem. The current episode started less than 1 hour ago. The problem occurs constantly. The problem has not changed since onset.Pertinent negatives include no chest pain, no abdominal pain, no headaches and no shortness of breath. The symptoms are aggravated by standing and walking. Nothing relieves the symptoms. She has tried nothing for the symptoms. The treatment provided no relief.  Foot Pain Pertinent negatives include no chest pain, no abdominal pain, no headaches and no shortness of breath.       Past Medical History:  Diagnosis Date  . Endometriosis   . Ovarian cyst     Patient Active Problem List   Diagnosis Date Noted  . Obesity, unspecified 05/12/2015  . 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 OVARIAN CYST REMOVAL Bilateral    left salpingoophorectomy, right salpingectomy, right cystectomy 04/2008     OB History    Gravida  4   Para  4   Term      Preterm      AB      Living  4     SAB      IAB      Ectopic      Multiple      Live Births  4           Family History  Problem Relation Age of Onset  . Cancer Mother     Social History   Tobacco Use  . Smoking status: Never Smoker  . Smokeless tobacco: Never Used  Vaping Use  . Vaping Use: Never used  Substance Use Topics  . Alcohol use: No  . Drug use: No    Home Medications Prior to Admission medications   Medication Sig Start Date End Date Taking? Authorizing Provider  norgestimate-ethinyl estradiol (ORTHO-CYCLEN,SPRINTEC,PREVIFEM)  0.25-35 MG-MCG tablet Take 1 tablet by mouth daily. Patient not taking: Reported on 05/11/2015 11/27/12   Tilda Burrow, MD    Allergies    Patient has no known allergies.  Review of Systems   Review of Systems  Constitutional: Negative for chills and fever.  HENT: Negative for ear pain and sore throat.   Eyes: Negative for pain and visual disturbance.  Respiratory: Negative for cough and shortness of breath.   Cardiovascular: Negative for chest pain and palpitations.  Gastrointestinal: Negative for abdominal pain and vomiting.  Genitourinary: Negative for dysuria and hematuria.  Musculoskeletal: Negative for arthralgias and back pain.  Skin: Negative for color change and rash.  Neurological: Negative for seizures, syncope and headaches.  All other systems reviewed and are negative.   Physical Exam Updated Vital Signs BP (!) 138/95 (BP Location: Left Arm)   Pulse 92   Temp 98.3 F (36.8 C) (Oral)   Resp 17   Ht 5\' 3"  (1.6 m)   Wt 87.2 kg   LMP 01/31/2009 (Approximate)   SpO2 100%   BMI 34.05 kg/m   Physical Exam Vitals and nursing note reviewed.  HENT:     Head: Normocephalic and atraumatic.  Eyes:     General: No scleral icterus. Pulmonary:     Effort: Pulmonary effort is normal. No respiratory distress.  Musculoskeletal:     Cervical back: Normal range of motion.     Comments: There is mild swelling over the lateral aspect of the right foot and associated tenderness to palpation. No ankle tenderness. Distal pulses are normal.   There is bruising and tenderness over the left great toe with limited range of motion at the IP joint.   Skin:    General: Skin is warm and dry.     Capillary Refill: Capillary refill takes less than 2 seconds.  Neurological:     General: No focal deficit present.     Mental Status: She is alert. Mental status is at baseline.  Psychiatric:        Mood and Affect: Mood normal.     ED Results / Procedures / Treatments   Labs (all  labs ordered are listed, but only abnormal results are displayed) Labs Reviewed - No data to display  EKG None  Radiology CT Foot Right Wo Contrast  Result Date: 06/06/2020 CLINICAL DATA:  Right foot pain.  Question cuboid fracture. EXAM: CT OF THE RIGHT FOOT WITHOUT CONTRAST TECHNIQUE: Multidetector CT imaging of the right foot was performed according to the standard protocol. Multiplanar CT image reconstructions were also generated. COMPARISON:  Radiograph earlier today. FINDINGS: Bones/Joint/Cartilage Mildly displaced and comminuted cuboid fracture. Minimal involvement of the calcaneal cuboid joint. Tiny chip fracture through the anterolateral process of the calcaneus. Remaining tarsal bones are intact. Metatarsals are intact. Small plantar calcaneal spur. Ligaments Suboptimally assessed by CT. Muscles and Tendons Intact Achilles tendon.  No confluent intramuscular hematoma. Soft tissues Mild lateral soft tissue edema adjacent to the fracture sites. IMPRESSION: 1. Mildly displaced and comminuted cuboid fracture. Minimal involvement of the calcaneal cuboid joint. 2. Tiny chip fracture through the anterolateral process of the calcaneus. Electronically Signed   By: Narda Rutherford M.D.   On: 06/06/2020 21:07   DG Foot 2 Views Left  Result Date: 06/06/2020 CLINICAL DATA:  Fall with anterior foot pain. EXAM: LEFT FOOT - 2 VIEW COMPARISON:  None. FINDINGS: There is no evidence of fracture or dislocation. A plantar calcaneal enthesophyte is noted. IMPRESSION: No acute osseous injury. Electronically Signed   By: Romona Curls M.D.   On: 06/06/2020 19:29   DG Foot 2 Views Right  Result Date: 06/06/2020 CLINICAL DATA:  Fall with foot pain. EXAM: RIGHT FOOT - 2 VIEW COMPARISON:  None. FINDINGS: There is no evidence of fracture or dislocation. Small posterior and plantar calcaneal enthesophytes are noted. IMPRESSION: No acute osseous injury. Electronically Signed   By: Romona Curls M.D.   On: 06/06/2020  19:30   DG Toe Great Left  Result Date: 06/06/2020 CLINICAL DATA:  Fall EXAM: LEFT GREAT TOE COMPARISON:  None. FINDINGS: Minimally displaced intra-articular fracture of the distal phalanx of the left great toe. No dislocation. Moderate soft tissue swelling. IMPRESSION: Minimally displaced intra-articular fracture of the distal phalanx of the left foot great toe. Electronically Signed   By: Deatra Robinson M.D.   On: 06/06/2020 20:55    Procedures Procedures   Medications Ordered in ED Medications  oxyCODONE-acetaminophen (PERCOCET/ROXICET) 5-325 MG per tablet 2 tablet (has no administration in time range)    ED Course  I have reviewed the triage vital signs and the nursing notes.  Pertinent labs & imaging results that were available during my care of the patient were  reviewed by me and considered in my medical decision making (see chart for details).    MDM Rules/Calculators/A&P                          Areebah Ramirez-Ochoa presented after missing a step.  She had pain over the lateral right foot and over the great toe of the left foot.  Initial x-rays were normal, but upon my review, I was concerned about a possible cuboid fracture on the right foot.  CT scan was ordered and it was positive for a cuboid fracture as well as a minor fracture of her calcaneus.  She will be nonweightbearing in a walking boot.  She also has a fracture of the distal phalanx of the left great toe and was placed in a walking boot.  I did tell her that she can bear some weight on this foot.  She was given instructions on follow-up.  Final Clinical Impression(s) / ED Diagnoses Final diagnoses:  Closed displaced fracture of cuboid of right foot, initial encounter  Closed nondisplaced fracture of anterior process of right calcaneus, initial encounter  Closed nondisplaced fracture of distal phalanx of left great toe, initial encounter    Rx / DC Orders ED Discharge Orders         Ordered     oxyCODONE-acetaminophen (PERCOCET/ROXICET) 5-325 MG tablet  Every 6 hours PRN        06/06/20 2138           Koleen Distance, MD 06/06/20 2146

## 2020-06-06 NOTE — Discharge Instructions (Addendum)
Wear both boots at all times. Do not bear weight on your right foot.

## 2020-06-06 NOTE — ED Notes (Signed)
Pt verbalizes understanding of discharge instructions. Opportunity for questioning and answers were provided. Armand removed by staff, pt discharged from ED to home.  

## 2020-06-06 NOTE — ED Triage Notes (Signed)
Pt slipped down the second to last step and fell onto our knees causing her to land on her feet "funny". Pt c/o bilateral anterior foot pain, right worse than left.

## 2020-09-07 ENCOUNTER — Other Ambulatory Visit (HOSPITAL_COMMUNITY): Payer: Self-pay | Admitting: Family Medicine

## 2020-09-07 DIAGNOSIS — Z1231 Encounter for screening mammogram for malignant neoplasm of breast: Secondary | ICD-10-CM

## 2020-09-16 ENCOUNTER — Ambulatory Visit (HOSPITAL_COMMUNITY): Payer: Self-pay

## 2021-06-02 ENCOUNTER — Other Ambulatory Visit (HOSPITAL_COMMUNITY): Payer: Self-pay | Admitting: Obstetrics and Gynecology

## 2021-06-02 DIAGNOSIS — Z1231 Encounter for screening mammogram for malignant neoplasm of breast: Secondary | ICD-10-CM

## 2021-06-03 ENCOUNTER — Encounter (HOSPITAL_COMMUNITY): Payer: Self-pay

## 2021-06-03 ENCOUNTER — Ambulatory Visit (HOSPITAL_COMMUNITY)
Admission: RE | Admit: 2021-06-03 | Discharge: 2021-06-03 | Disposition: A | Discharge status: Home / Self Care | Payer: Self-pay | Source: Ambulatory Visit | Attending: Obstetrics and Gynecology | Admitting: Obstetrics and Gynecology

## 2021-06-03 ENCOUNTER — Other Ambulatory Visit: Payer: Self-pay

## 2021-06-03 ENCOUNTER — Inpatient Hospital Stay (HOSPITAL_COMMUNITY): Payer: Self-pay | Attending: Obstetrics and Gynecology | Admitting: *Deleted

## 2021-06-03 VITALS — BP 120/91 | Wt 200.3 lb

## 2021-06-03 DIAGNOSIS — Z1211 Encounter for screening for malignant neoplasm of colon: Secondary | ICD-10-CM

## 2021-06-03 DIAGNOSIS — Z1231 Encounter for screening mammogram for malignant neoplasm of breast: Secondary | ICD-10-CM | POA: Insufficient documentation

## 2021-06-03 DIAGNOSIS — Z01419 Encounter for gynecological examination (general) (routine) without abnormal findings: Secondary | ICD-10-CM

## 2021-06-03 NOTE — Patient Instructions (Signed)
Explained breast self awareness with Sherry Ramirez. Pap smear completed today. Let her know BCCCP will cover Pap smears and HPV typing every 5 years unless has a history of abnormal Pap smears. Referred patient to [facility] for a screening mammogram. Appointment scheduled Friday, June 03, 2021 at 1245. Patient escorted to mammography following BCCCP appointment.  Let patient know will follow up with her within the next couple weeks with results of Pap smear by phone. Informed patient that [facility] will follow up with her within the next couple of weeks with results of mammogram by letter or phone. Sherry Ramirez verbalized understanding. ? ?Georgine Wiltse, Arvil Chaco, RN ?11:43 AM ? ? ? ? ?

## 2021-06-03 NOTE — Progress Notes (Signed)
[name] is a 46 y.o. G4P4 female who presents to [facility] clinic today with no complaints.  ?  ?Pap Smear: Pap smear completed today. Last Pap smear was in October 2019 at the [facility] in [place] and normal per patient. Per patient has no history of an abnormal Pap smear. Last Pap smear result is not in Epic. Previous Pap smear result on 02/04/2010 is in Epic. ?  ?Physical exam: ?Breasts ?Breasts symmetrical. No skin abnormalities bilateral breasts. No nipple retraction bilateral breasts. No nipple discharge bilateral breasts. No lymphadenopathy. No lumps palpated bilateral breasts. No complaints of pain or tenderness on exam.    ? ?MS DIGITAL SCREENING TOMO BILATERAL ? ?Result Date: 02/01/2018 ?CLINICAL DATA:  Screening. EXAM: DIGITAL SCREENING BILATERAL MAMMOGRAM WITH TOMO AND CAD COMPARISON:  None. ACR Breast Density Category b: There are scattered areas of fibroglandular density. FINDINGS: There are no findings suspicious for malignancy. Images were processed with CAD. IMPRESSION: No mammographic evidence of malignancy. A result letter of this screening mammogram will be mailed directly to the patient. RECOMMENDATION: Screening mammogram in one year. (Code:SM-B-01Y) BI-RADS CATEGORY  1: Negative. Electronically Signed   By: Baird Lyons M.D.   On: 02/01/2018 12:32   ? ?Pelvic/Bimanual ?Ext Genitalia ?No lesions, no swelling and no discharge observed on external genitalia.      ?  ?Vagina ?Vagina pink and normal texture. No lesions or discharge observed in vagina.      ?  ?Cervix ?Cervix is present. Cervix pink and of normal texture. Cervix slightly reddened around os. Cervix friable. No discharge observed.  ?  ?Uterus ?Uterus is present and palpable. Uterus in normal position and normal size.      ?  ?Adnexae ?Bilateral ovaries present and palpable. No tenderness on palpation.       ?  ?Rectovaginal ?No rectal exam completed today since patient had no rectal complaints. No skin  abnormalities observed on exam.   ?  ?Smoking History: ?Patient has never smoked. ?  ?Patient Navigation: ?Patient education provided. Access to services provided for patient through [facility] program. Spanish interpreter Mattie Marlin from [facility] provided.  ? ?Colorectal Cancer Screening: ?Per patient has never had colonoscopy completed. FIT Test given to patient to complete. No complaints today.  ?  ?Breast and Cervical Cancer Risk Assessment: ?Patient does not have family history of breast cancer, known genetic mutations, or radiation treatment to the chest before age 52. Patient does not have history of cervical dysplasia, immunocompromised, or DES exposure in-utero. ? ?Risk Assessment   ?No risk assessment data for the current encounter ? Risk Scores   ? ?   01/31/2018  ? Last edited by: Priscille Heidelberg, RN  ? 5-year risk: 0.3 %  ? Lifetime risk: 5.1 %  ? ?  ?  ? ?  ? ? ?A: ?BCCCP exam with pap smear ?No complaints. ? ?P: ?Referred patient to [name] for a screening mammogram. Appointment scheduled Friday, June 03, 2021 at 1245. ? ?Priscille Heidelberg, RN ?06/03/2021 11:43 AM   ?

## 2021-06-07 ENCOUNTER — Telehealth: Payer: Self-pay

## 2021-06-07 LAB — CYTOLOGY - PAP
Comment: NEGATIVE
Diagnosis: NEGATIVE
High risk HPV: NEGATIVE

## 2021-06-07 NOTE — Telephone Encounter (Signed)
Via Natale Lay, Spanish Interpreter [facility]) Patient informed negative Pap/HPV results, next pap due in 5 years. Patient verbalized understanding.  ?

## 2021-06-18 LAB — FECAL OCCULT BLOOD, IMMUNOCHEMICAL: Fecal Occult Bld: NEGATIVE

## 2021-06-20 ENCOUNTER — Telehealth: Payer: Self-pay

## 2021-06-20 NOTE — Telephone Encounter (Signed)
Via Julie Sowell, Spanish Interpreter ([place]), Patient informed negative FIT test results. Patient verbalized understanding.  

## 2022-11-24 ENCOUNTER — Ambulatory Visit (HOSPITAL_COMMUNITY)
Admission: RE | Admit: 2022-11-24 | Discharge: 2022-11-24 | Disposition: A | Discharge status: Home / Self Care | Payer: Self-pay | Source: Ambulatory Visit | Attending: Hematology and Oncology | Admitting: Hematology and Oncology

## 2022-11-24 ENCOUNTER — Inpatient Hospital Stay: Payer: Self-pay | Attending: Obstetrics and Gynecology | Admitting: Hematology and Oncology

## 2022-11-24 ENCOUNTER — Encounter (HOSPITAL_COMMUNITY): Payer: Self-pay

## 2022-11-24 VITALS — BP 113/76 | Wt 200.8 lb

## 2022-11-24 DIAGNOSIS — Z1231 Encounter for screening mammogram for malignant neoplasm of breast: Secondary | ICD-10-CM

## 2022-11-24 NOTE — Patient Instructions (Signed)
Taught Sherry Ramirez about self breast awareness and gave educational materials to take home. Patient did not need a Pap smear today due to last Pap smear was in 06/03/2021 per patient. Let her know BCCCP will cover Pap smears every 5 years unless has a history of abnormal Pap smears. Referred patient to the [facility] for screening mammogram. Appointment scheduled for 11/24/2022. Patient aware of appointment and will be there. Let patient know will follow up with her within the next couple weeks with results. Sherry Ramirez verbalized understanding.  Pascal Lux, NP 10:51 AM

## 2022-11-24 NOTE — Progress Notes (Signed)
Sherry Ramirez is a 47 y.o. female who presents to [facility] clinic today with no complaints.    Pap Smear: Pap not smear completed today. Last Pap smear was 06/03/2021 at [facility] clinic and was normal. Per patient has no history of an abnormal Pap smear. Last Pap smear result is available in Epic.   Physical exam: Breasts Breasts symmetrical. No skin abnormalities bilateral breasts. No nipple retraction bilateral breasts. No nipple discharge bilateral breasts. No lymphadenopathy. No lumps palpated bilateral breasts. MS DIGITAL SCREENING TOMO BILATERAL  Result Date: 06/06/2021 CLINICAL DATA:  Screening. EXAM: DIGITAL SCREENING BILATERAL MAMMOGRAM WITH TOMOSYNTHESIS AND CAD TECHNIQUE: Bilateral screening digital craniocaudal and mediolateral oblique mammograms were obtained. Bilateral screening digital breast tomosynthesis was performed. The images were evaluated with computer-aided detection. COMPARISON:  Previous exam(s). ACR Breast Density Category b: There are scattered areas of fibroglandular density. FINDINGS: There are no findings suspicious for malignancy. IMPRESSION: No mammographic evidence of malignancy. A result letter of this screening mammogram will be mailed directly to the patient. RECOMMENDATION: Screening mammogram in one year. (Code:SM-B-01Y) BI-RADS CATEGORY  1: Negative. Electronically Signed   By: Ted Mcalpine M.D.   On: 06/06/2021 08:25   MS DIGITAL SCREENING TOMO BILATERAL  Result Date: 02/01/2018 CLINICAL DATA:  Screening. EXAM: DIGITAL SCREENING BILATERAL MAMMOGRAM WITH TOMO AND CAD COMPARISON:  None. ACR Breast Density Category b: There are scattered areas of fibroglandular density. FINDINGS: There are no findings suspicious for malignancy. Images were processed with CAD. IMPRESSION: No mammographic evidence of malignancy. A result letter of this screening mammogram will be mailed directly to the patient. RECOMMENDATION: Screening mammogram in one year.  (Code:SM-B-01Y) BI-RADS CATEGORY  1: Negative. Electronically Signed   By: Baird Lyons M.D.   On: 02/01/2018 12:32          Pelvic/Bimanual Pap is not indicated today    Smoking History: Patient has never smoked and was not referred to quit line.    Patient Navigation: Patient education provided. Access to services provided for patient through [facility] program. No interpreter provided. No transportation provided   Colorectal Cancer Screening: Per patient has had colonoscopy completed on 12/30/2021 with results revealing diverticulosis in the sigmoid colon. Repeat in 10 years.  No complaints today.    Breast and Cervical Cancer Risk Assessment: Patient does not have family history of breast cancer, known genetic mutations, or radiation treatment to the chest before age 3. Patient does not have history of cervical dysplasia, immunocompromised, or DES exposure in-utero.  Risk Scores as of Encounter on 11/24/2022     Sherry Ramirez           5-year 0.63%   Lifetime 6.82%   This patient is [place] but has no documented birth country, so the [place] model used data from [place] patients to calculate their risk score. Document a birth country in the Demographics activity for a more accurate score.         Last calculated by Narda Rutherford, LPN on 78/29/5621 at 11:21 AM          A: BCCCP exam without pap smear No complaints with benign exam.   P: Referred patient to the [facility] for a screening mammogram. Appointment scheduled 11/24/2022.  Ilda Basset A, NP 11/24/2022 10:51 AM

## 2022-11-24 NOTE — Addendum Note (Signed)
Addended by: Ilda Basset on: 11/24/2022 11:39 AM   Modules accepted: Orders

## 2023-04-10 ENCOUNTER — Other Ambulatory Visit: Payer: Self-pay

## 2023-05-22 ENCOUNTER — Telehealth: Payer: Self-pay

## 2023-05-22 NOTE — Telephone Encounter (Signed)
 Called to follow up after [facility] client's appointment with [facility]. Called her husband Lars Mage as he speaks [place]. He states her appointment went well, they had originally wanted to start her on Trulicity however [facility] does not carry that for newly prescribed patients and otherwise it was unaffordable. Today they discussed starting Ozempic for Metabolic syndrome and improved A1Cs, they provided her with an application and requested more paystubs. Husband states they submitted those and are awaiting decision. Discussed with husband Lars Mage to please let me know if she isn't approved as there may be other options for either ozempic or Trulicity. He states he will keep me updated.  Client did have labs drawn today, will monitor for A1C results and provide follow up and education as needed.  Francee Nodal RN [employer]

## 2023-10-16 IMAGING — MG MM DIGITAL SCREENING BILAT W/ TOMO AND CAD
8 series · 8 of 24 positions shown · non-contrast
Comparison: Previous exam(s).

CLINICAL DATA: Screening.

EXAM:
DIGITAL SCREENING BILATERAL MAMMOGRAM WITH TOMOSYNTHESIS AND CAD
TECHNIQUE: Bilateral screening digital craniocaudal and mediolateral oblique
mammograms were obtained. Bilateral screening digital breast
tomosynthesis was performed. The images were evaluated with
computer-aided detection.

[L MLO synth-2D]
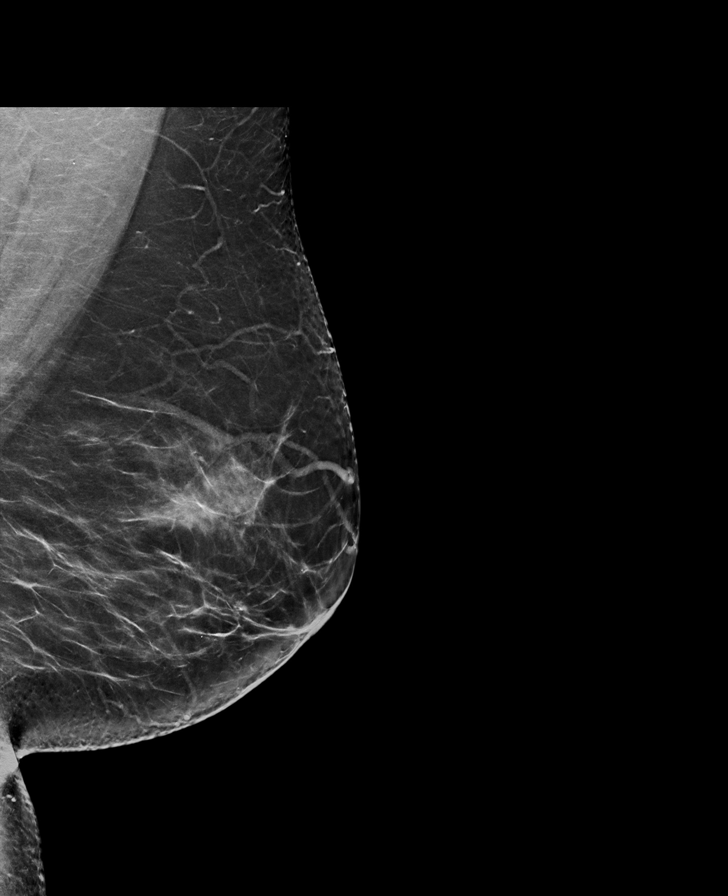

[R CC synth-2D]
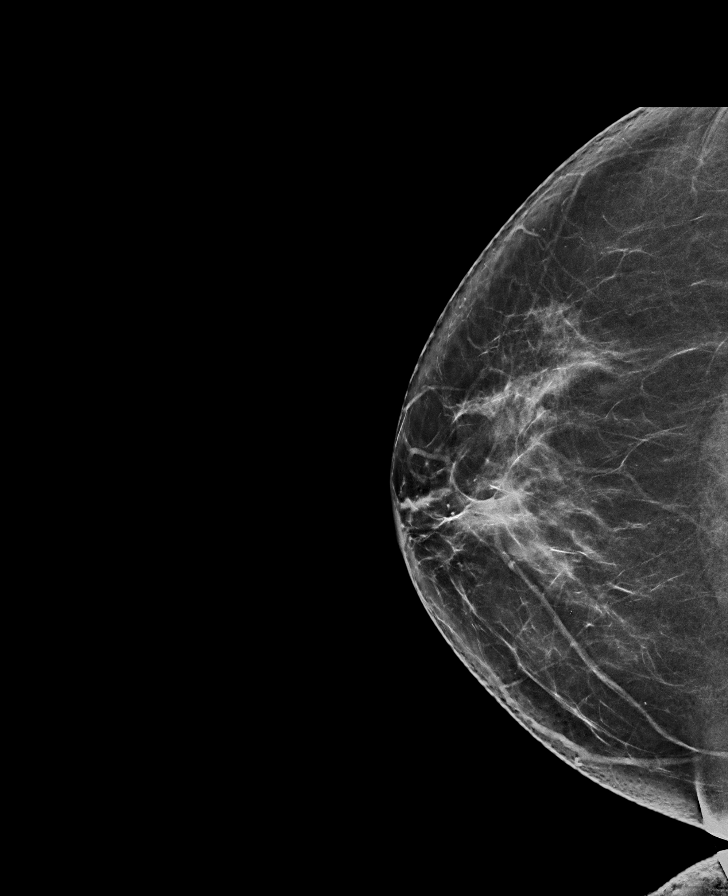

[R MLO synth-2D]
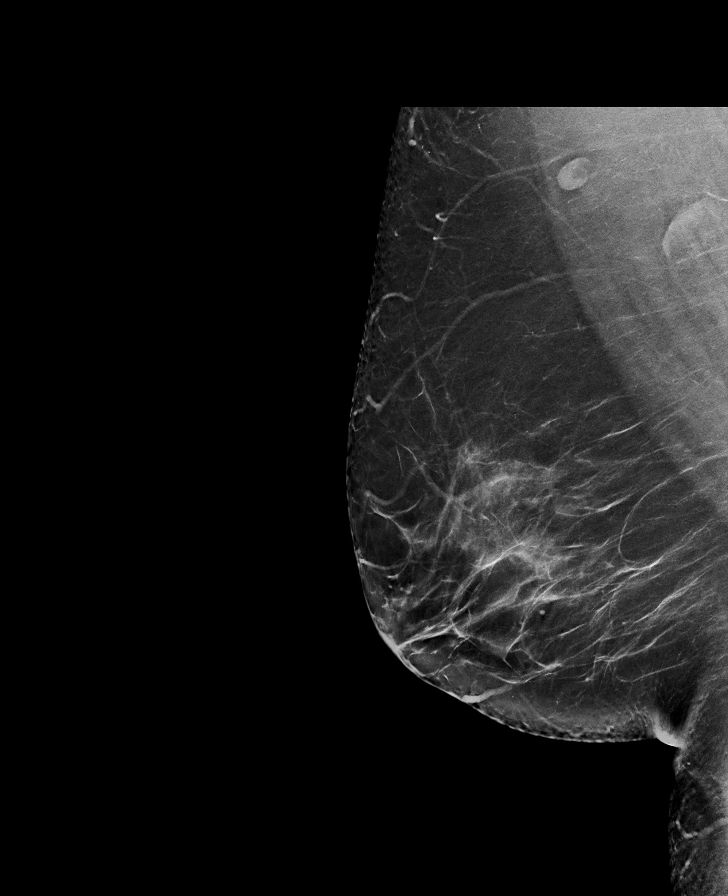

[L CC synth-2D]
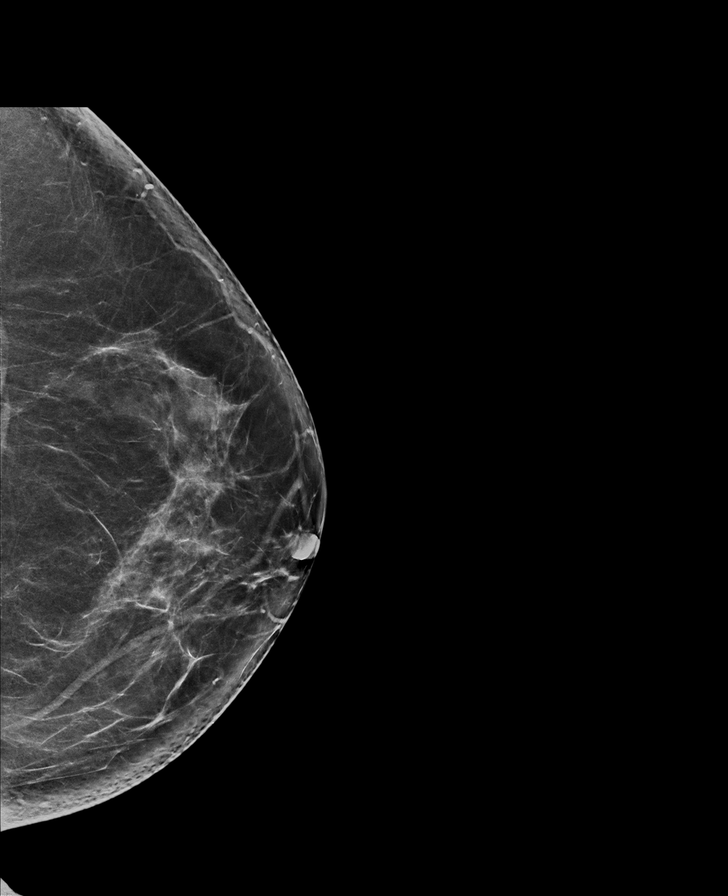

[R CC tomo · tomo slice 38/75.0]
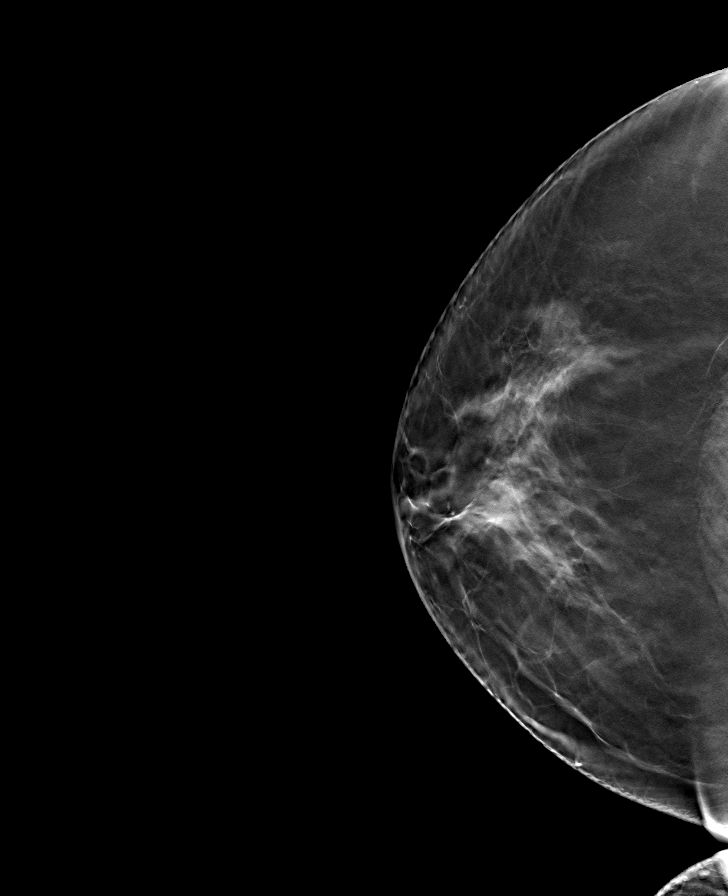

[L CC tomo · tomo slice 39/76.0]
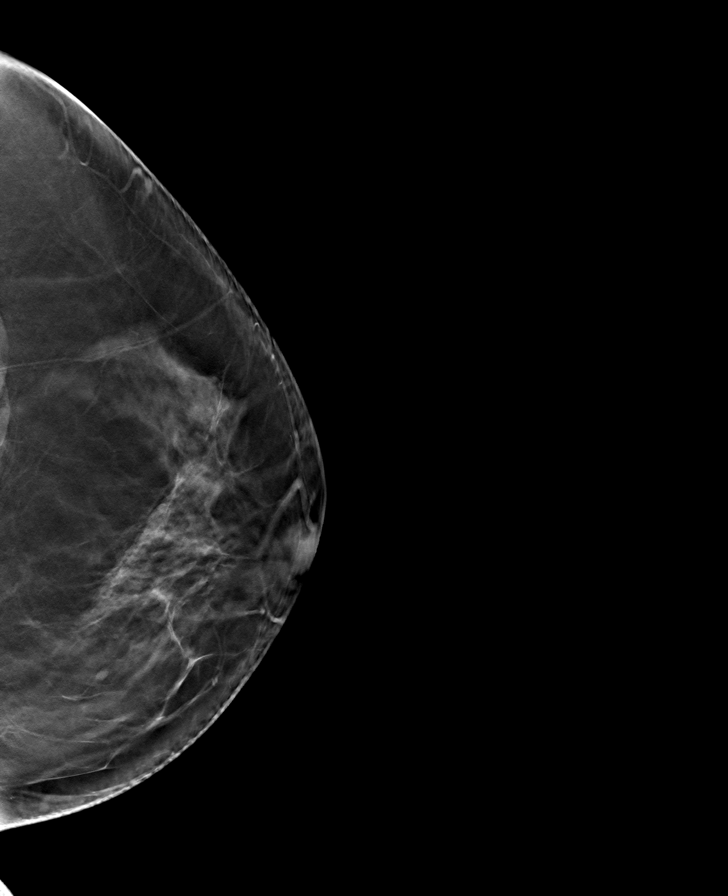

[R MLO tomo · tomo slice 47/92.0]
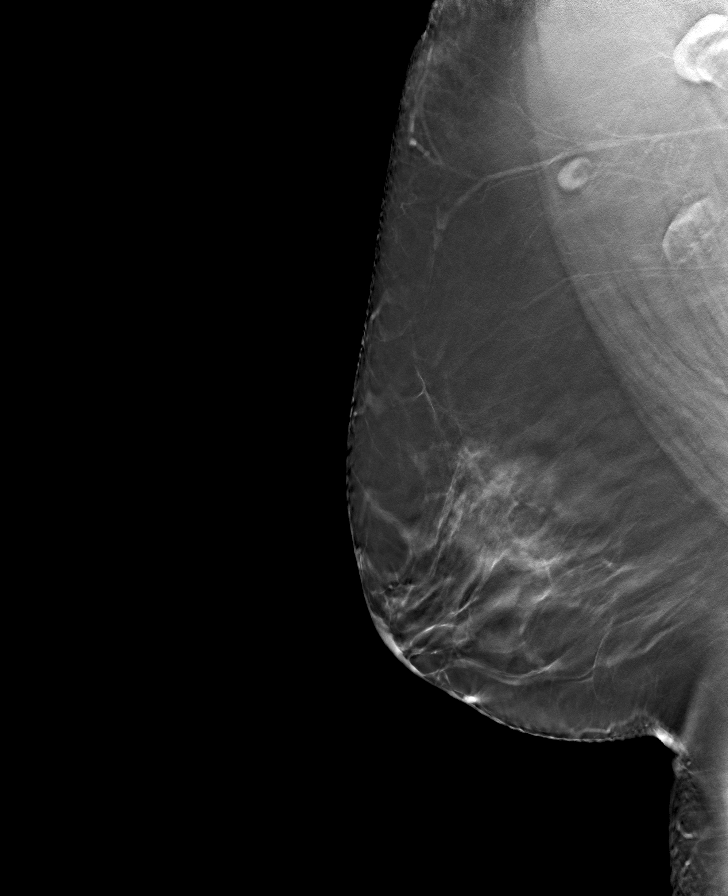

[L MLO tomo · tomo slice 41/80.0]
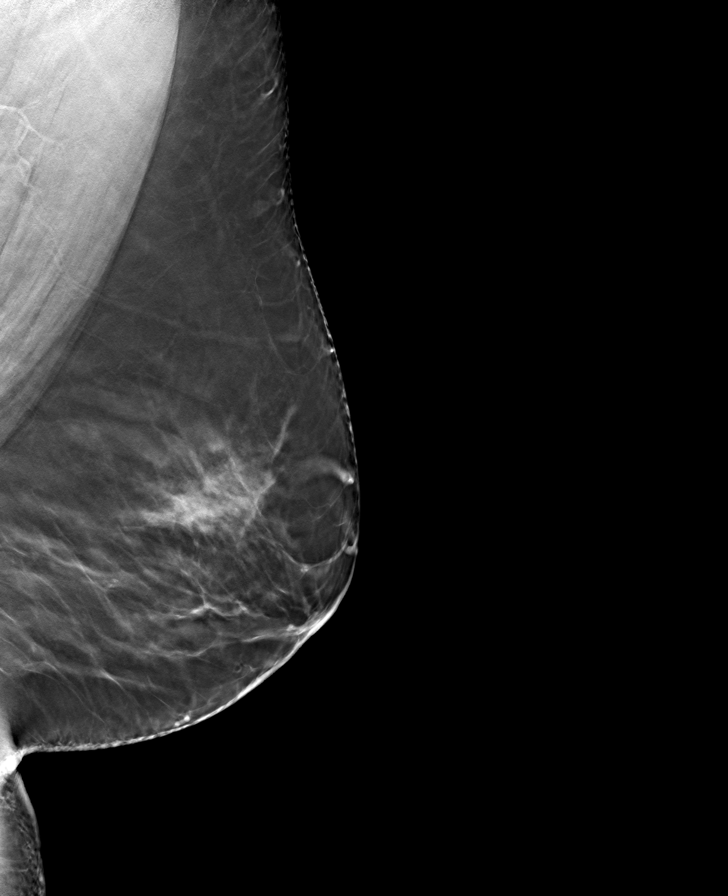

[8 of 24 positions shown; findings below may reference images not displayed]

ACR Breast Density Category b: There are scattered areas of
fibroglandular density.
FINDINGS: There are no findings suspicious for malignancy.
IMPRESSION: No mammographic evidence of malignancy. A result letter of this
screening mammogram will be mailed directly to the patient.

RECOMMENDATION:
Screening mammogram in one year. (Code:51-O-LD2)

BI-RADS CATEGORY  1: Negative.

## 2023-11-21 ENCOUNTER — Telehealth: Payer: Self-pay

## 2023-11-21 ENCOUNTER — Other Ambulatory Visit (HOSPITAL_COMMUNITY): Payer: Self-pay | Admitting: Obstetrics and Gynecology

## 2023-11-21 DIAGNOSIS — N63 Unspecified lump in unspecified breast: Secondary | ICD-10-CM

## 2023-11-21 NOTE — Telephone Encounter (Addendum)
 Telephoned patient using interpreter#[id number]. Left a voice message with BCCCP scheduling contact information.  Patient telephoned and will call back with spouse's income and complete screening process. BCCCP

## 2023-11-26 ENCOUNTER — Inpatient Hospital Stay: Payer: Self-pay | Attending: Obstetrics and Gynecology | Admitting: Oncology

## 2023-11-26 VITALS — BP 136/90 | Ht 63.0 in | Wt 204.7 lb

## 2023-11-26 DIAGNOSIS — Z1239 Encounter for other screening for malignant neoplasm of breast: Secondary | ICD-10-CM

## 2023-11-26 NOTE — Progress Notes (Signed)
 [name] is a 48 y.o. female who presents to [facility] clinic today with complaint of right breast tenderness x 3 to 4 weeks.  Palpable BB sized right mass at 7:00 o'clock on perimeter of nipple.   Pap Smear: Pap not smear completed today. Last Pap smear was 06/03/2021 at [facility] clinic and was normal. Per patient has no history of an abnormal Pap smear. Last Pap smear result is available in Epic.   Physical exam: Breasts Breasts symmetrical. No skin abnormalities bilateral breasts. No nipple retraction bilateral breasts. No nipple discharge bilateral breasts. No lymphadenopathy. Left breast mass at 7 o'clock around parameter of nipple pea-sized. Non movable. Tender on exam.   MM DIGITAL SCREENING BILATERAL Result Date: 11/28/2022 CLINICAL DATA:  Screening. EXAM: DIGITAL SCREENING BILATERAL MAMMOGRAM WITH CAD TECHNIQUE: Bilateral screening digital craniocaudal and mediolateral oblique mammograms were obtained. The images were evaluated with computer-aided detection. COMPARISON:  Previous exam(s). ACR Breast Density Category b: There are scattered areas of fibroglandular density. FINDINGS: There are no findings suspicious for malignancy. IMPRESSION: No mammographic evidence of malignancy. A result letter of this screening mammogram will be mailed directly to the patient. RECOMMENDATION: Screening mammogram in one year. (Code:SM-B-01Y) BI-RADS CATEGORY  1: Negative. Electronically Signed   By: Dobrinka  Dimitrova M.D.   On: 11/28/2022 13:33   MS DIGITAL SCREENING TOMO BILATERAL Result Date: 06/06/2021 CLINICAL DATA:  Screening. EXAM: DIGITAL SCREENING BILATERAL MAMMOGRAM WITH TOMOSYNTHESIS AND CAD TECHNIQUE: Bilateral screening digital craniocaudal and mediolateral oblique mammograms were obtained. Bilateral screening digital breast tomosynthesis was performed. The images were evaluated with computer-aided detection. COMPARISON:  Previous exam(s). ACR Breast Density Category b: There are  scattered areas of fibroglandular density. FINDINGS: There are no findings suspicious for malignancy. IMPRESSION: No mammographic evidence of malignancy. A result letter of this screening mammogram will be mailed directly to the patient. RECOMMENDATION: Screening mammogram in one year. (Code:SM-B-01Y) BI-RADS CATEGORY  1: Negative. Electronically Signed   By: Dobrinka  Dimitrova M.D.   On: 06/06/2021 08:25          Pelvic/Bimanual Pap is not indicated today    Smoking History: Patient has never smoked and was not referred to quit line.    Patient Navigation: Patient education provided. Access to services provided for patient through [facility] program.  Spanish-speaking patient.  Sonia Heidrich used from interpretive services. no transportation provided.   Colorectal Cancer Screening: Per patient has had colonoscopy completed on 12/30/2021 with results revealing diverticulosis in the sigmoid colon. Repeat in 10 years. No complaints today.    Breast and Cervical Cancer Risk Assessment: Patient does not have family history of breast cancer, known genetic mutations, or radiation treatment to the chest before age 53. Patient does not have history of cervical dysplasia, immunocompromised, or DES exposure in-utero.  Risk Scores as of Encounter on 11/26/2023     Sherry Ramirez           5-year 0.65%   Lifetime 6.72%   This patient is [place] but has no documented birth country, so the [place] model used data from [place] patients to calculate their risk score. Document a birth country in the Demographics activity for a more accurate score.         Last calculated by Rogerio Tempie SQUIBB, LPN on 89/86/7974 at 11:35 AM        A: BCCCP exam without pap smear No complaints with benign exam.   P: Referred patient to the [facility] for a diagnostic mammogram. Appointment scheduled 11/29/23 at  9 am.   Geofm Delon BRAVO, NP 11/26/2023 11:53 AM

## 2023-11-29 ENCOUNTER — Encounter (HOSPITAL_COMMUNITY): Payer: Self-pay

## 2023-11-29 ENCOUNTER — Ambulatory Visit (HOSPITAL_COMMUNITY)
Admission: RE | Admit: 2023-11-29 | Discharge: 2023-11-29 | Disposition: A | Discharge status: Home / Self Care | Payer: Self-pay | Source: Ambulatory Visit | Attending: Obstetrics and Gynecology | Admitting: Obstetrics and Gynecology

## 2023-11-29 DIAGNOSIS — N63 Unspecified lump in unspecified breast: Secondary | ICD-10-CM
